# Patient Record
Sex: Male | Born: 2000
Health system: Southern US, Community
[De-identification: ages and names within clinical notes are randomized; demographics above are authoritative.]

## PROBLEM LIST (undated history)

## (undated) HISTORY — PX: APPENDECTOMY: SHX54

---
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
## 2023-05-29 ENCOUNTER — Emergency Department (HOSPITAL_BASED_OUTPATIENT_CLINIC_OR_DEPARTMENT_OTHER): Payer: Commercial Managed Care - PPO | Admitting: Radiology

## 2023-05-29 ENCOUNTER — Other Ambulatory Visit: Payer: Self-pay

## 2023-05-29 ENCOUNTER — Emergency Department (HOSPITAL_BASED_OUTPATIENT_CLINIC_OR_DEPARTMENT_OTHER): Payer: Commercial Managed Care - PPO

## 2023-05-29 ENCOUNTER — Emergency Department (HOSPITAL_BASED_OUTPATIENT_CLINIC_OR_DEPARTMENT_OTHER)
Admission: EM | Admit: 2023-05-29 | Discharge: 2023-05-30 | Disposition: A | Discharge status: Home / Self Care | Payer: Commercial Managed Care - PPO | Attending: Emergency Medicine | Admitting: Emergency Medicine

## 2023-05-29 ENCOUNTER — Encounter (HOSPITAL_BASED_OUTPATIENT_CLINIC_OR_DEPARTMENT_OTHER): Payer: Self-pay

## 2023-05-29 DIAGNOSIS — R1012 Left upper quadrant pain: Secondary | ICD-10-CM | POA: Insufficient documentation

## 2023-05-29 LAB — CBC WITH DIFFERENTIAL/PLATELET
Abs Immature Granulocytes: 0.05 10*3/uL (ref 0.00–0.07)
Basophils Absolute: 0.1 10*3/uL (ref 0.0–0.1)
Basophils Relative: 1 %
Eosinophils Absolute: 0.2 10*3/uL (ref 0.0–0.5)
Eosinophils Relative: 2 %
HCT: 44.4 % (ref 39.0–52.0)
Hemoglobin: 15.2 g/dL (ref 13.0–17.0)
Immature Granulocytes: 1 %
Lymphocytes Relative: 22 %
Lymphs Abs: 2.3 10*3/uL (ref 0.7–4.0)
MCH: 29.5 pg (ref 26.0–34.0)
MCHC: 34.2 g/dL (ref 30.0–36.0)
MCV: 86 fL (ref 80.0–100.0)
Monocytes Absolute: 1 10*3/uL (ref 0.1–1.0)
Monocytes Relative: 10 %
Neutro Abs: 6.7 10*3/uL (ref 1.7–7.7)
Neutrophils Relative %: 64 %
Platelets: 203 10*3/uL (ref 150–400)
RBC: 5.16 MIL/uL (ref 4.22–5.81)
RDW: 12.5 % (ref 11.5–15.5)
WBC: 10.3 10*3/uL (ref 4.0–10.5)
nRBC: 0 % (ref 0.0–0.2)

## 2023-05-29 LAB — COMPREHENSIVE METABOLIC PANEL
ALT: 52 U/L — ABNORMAL HIGH (ref 0–44)
AST: 25 U/L (ref 15–41)
Albumin: 4.6 g/dL (ref 3.5–5.0)
Alkaline Phosphatase: 60 U/L (ref 38–126)
Anion gap: 8 (ref 5–15)
BUN: 16 mg/dL (ref 6–20)
CO2: 29 mmol/L (ref 22–32)
Calcium: 10 mg/dL (ref 8.9–10.3)
Chloride: 102 mmol/L (ref 98–111)
Creatinine, Ser: 1.07 mg/dL (ref 0.61–1.24)
GFR, Estimated: >60 mL/min (ref >60)
Glucose, Bld: 103 mg/dL — ABNORMAL HIGH (ref 70–99)
Potassium: 3.8 mmol/L (ref 3.5–5.1)
Sodium: 139 mmol/L (ref 135–145)
Total Bilirubin: 0.3 mg/dL (ref <1.2)
Total Protein: 7.4 g/dL (ref 6.5–8.1)

## 2023-05-29 LAB — URINALYSIS, W/ REFLEX TO CULTURE (INFECTION SUSPECTED)
Bacteria, UA: NONE SEEN
Bilirubin Urine: NEGATIVE
Glucose, UA: NEGATIVE mg/dL
Hgb urine dipstick: NEGATIVE
Ketones, ur: NEGATIVE mg/dL
Leukocytes,Ua: NEGATIVE
Nitrite: NEGATIVE
Protein, ur: NEGATIVE mg/dL
Specific Gravity, Urine: 1.01 (ref 1.005–1.030)
pH: 7 (ref 5.0–8.0)

## 2023-05-29 LAB — LIPASE, BLOOD: Lipase: 17 U/L (ref 11–51)

## 2023-05-29 LAB — TROPONIN I (HIGH SENSITIVITY): Troponin I (High Sensitivity): 2 ng/L (ref <18)

## 2023-05-29 MED ORDER — ALUM & MAG HYDROXIDE-SIMETH 200-200-20 MG/5ML PO SUSP
30.0000 mL | Freq: Once | ORAL | Status: AC
Start: 2023-05-29 — End: 2023-05-29
  Administered 2023-05-29: 30 mL via ORAL
  Filled 2023-05-29: qty 30

## 2023-05-29 MED ORDER — LIDOCAINE VISCOUS HCL 2 % MT SOLN
15.0000 mL | Freq: Once | OROMUCOSAL | Status: DC
  Filled 2023-05-29: qty 15

## 2023-05-29 MED ORDER — PANTOPRAZOLE SODIUM 20 MG PO TBEC: 20.0000 mg | DELAYED_RELEASE_TABLET | Freq: Every day | ORAL | 0 refills | Status: DC

## 2023-05-29 MED ORDER — IOHEXOL 300 MG/ML  SOLN
100.0000 mL | Freq: Once | INTRAMUSCULAR | Status: AC | PRN
  Administered 2023-05-29: 100 mL via INTRAVENOUS

## 2023-05-29 NOTE — ED Provider Notes (Signed)
  Physical Exam  BP (!) 160/74 (BP Location: Right Arm)   Pulse 85   Temp 98.1 F (36.7 C) (Oral)   Resp 20   Ht 6\' 4"  (1.93 m)   Wt (!) 145.2 kg   SpO2 100%   BMI 38.95 kg/m     Medical Decision Making Amount and/or Complexity of Data Reviewed Labs: ordered. Radiology: ordered.  Risk OTC drugs. Prescription drug management.   Patient is a 22 year old male presenting to emergency room with abdominal pain.  Patient reports abdominal pain is in the left upper quadrant.  Reports he has had intermittent constipation and diarrhea, for the past 10 days.  Patient has reported some associated nausea but has not had any increase in appetite.  Patient has no elevated lipase, no leukocytosis, no anemia.  Patient without any obvious electrolyte abnormality malady on CMP.  Please review prior ED provider's note for further information in HPI.  Received patient at shift from prior the prior ED PA.  Patient's likely stable for discharge however pending CT scan of abdomen and pelvis.  Spoke with patient who feels he is comfortable going home.  Patient reports his symptoms have improved.  Pantoprazole has already been sent for acid reflux.  I have also sent Bentyl for abdominal cramping.  Patient tolerating p.o. intake and overall well-appearing.  Hemodynamically stable.  No acute abnormality noted on CT scan.  Patient is stable for discharge.  Patient to follow-up with primary care to ensure resolution of symptoms.       Smitty Knudsen, PA-C 05/30/23 0144    Glyn Ade, MD 06/02/23 1455

## 2023-05-29 NOTE — ED Provider Notes (Signed)
[facility] EMERGENCY DEPARTMENT AT [facility] Provider Note   CSN: [id number] Arrival date & time: 05/29/23  2012    History  Chief Complaint  Patient presents with   Abdominal Pain    Bruce Boyd is a 22 y.o. male here for evaluation of epigastric and left upper quadrant pain that started about 3 hours PTA.  Describes the pain as sharp, aching.  Does not radiate to back.  Goes from his left lower chest wall into his upper abdomen.  He has never had anything like this previously.  Some nausea that vomiting.  States he did have "reflux" after eating pizza earlier tonight for dinner.  No fever, chest pain, shortness of breath, pain or swelling to lower legs, no history of PE or DVT.  No recent surgery, immobilization or malignancy.  He did report 2 weeks ago he had "severe" constipation.  Took mag citrate.  Evacuate his bowels a few days later however states he has not been "right" since then."  Feels like his bowel movements are not as large.  He denies any melena or bright red per rectum.  No history of kidney stones.  No pain or swelling to scrotum.  No cough, congestion, rhinorrhea.  Hx of appendectomy.   HPI     Home Medications Prior to Admission medications   Medication Sig Start Date End Date Taking? Authorizing Provider  pantoprazole (PROTONIX) 20 MG tablet Take 1 tablet (20 mg total) by mouth daily. 05/29/23  Yes Jeno Calleros A, PA-C      Allergies    Morphine    Review of Systems   Review of Systems  Constitutional: Negative.   HENT: Negative.    Respiratory: Negative.    Cardiovascular: Negative.   Gastrointestinal:  Positive for abdominal pain, constipation (resolved) and nausea. Negative for abdominal distention, anal bleeding, blood in stool, diarrhea, rectal pain and vomiting.  Genitourinary: Negative.   Musculoskeletal: Negative.   Skin: Negative.   Neurological: Negative.   All other systems reviewed and are negative.   Physical  Exam Updated Vital Signs BP (!) 160/74 (BP Location: Right Arm)   Pulse 85   Temp 98.1 F (36.7 C) (Oral)   Resp 20   Ht 6\' 4"  (1.93 m)   Wt (!) 145.2 kg   SpO2 100%   BMI 38.95 kg/m  Physical Exam Vitals and nursing note reviewed.  Constitutional:      General: He is not in acute distress.    Appearance: He is well-developed. He is not ill-appearing, toxic-appearing or diaphoretic.  HENT:     Head: Normocephalic and atraumatic.  Eyes:     Pupils: Pupils are equal, round, and reactive to light.  Cardiovascular:     Rate and Rhythm: Normal rate and regular rhythm.     Heart sounds: Normal heart sounds.  Pulmonary:     Effort: Pulmonary effort is normal. No respiratory distress.     Breath sounds: Normal breath sounds.  Abdominal:     General: Bowel sounds are normal. There is no distension.     Palpations: Abdomen is soft.     Tenderness: There is abdominal tenderness in the epigastric area and left upper quadrant. There is no right CVA tenderness, left CVA tenderness, guarding or rebound. Negative signs include Murphy's sign.     Hernia: No hernia is present.  Musculoskeletal:        General: Normal range of motion.     Cervical back: Normal range of motion and  neck supple.  Skin:    General: Skin is warm and dry.  Neurological:     General: No focal deficit present.     Mental Status: He is alert and oriented to person, place, and time.     ED Results / Procedures / Treatments   Labs (all labs ordered are listed, but only abnormal results are displayed) Labs Reviewed  CBC WITH DIFFERENTIAL/PLATELET  COMPREHENSIVE METABOLIC PANEL  LIPASE, BLOOD  URINALYSIS, W/ REFLEX TO CULTURE (INFECTION SUSPECTED)  TROPONIN I (HIGH SENSITIVITY)    EKG None  Radiology No results found.  Procedures Procedures    Medications Ordered in ED Medications  alum & mag hydroxide-simeth (MAALOX/MYLANTA) 200-200-20 MG/5ML suspension 30 mL (30 mLs Oral Given 05/29/23 2110)     And  lidocaine (XYLOCAINE) 2 % viscous mouth solution 15 mL (15 mLs Oral Not Given 05/29/23 2118)    ED Course/ Medical Decision Making/ A&P   22 year old here for evaluation of left upper abdominal pain which began about 3 hours PTA.  Noted few weeks ago he was constipated, subsequently cleared out after mag citrate however still has not been "right."  Ate pizza earlier his evening and stated he had "severe reflux" and then developed some left upper abdominal pain which starts at the left lower chest wall into his left upper abdomen.  Does not radiate.  No urinary symptoms.  He denies any overt chest pain, shortness of breath.  No clinical evidence of VTE, no recent surgery, immobilization or malignancy.  He is PERC negative.  Will plan on labs, imaging and reassess  Labs and imaging personally viewed and interpreted:  CBC without leukocytosis EKG without ischemic changes  Care transferred to oncoming provider who will FU on remaining labs, imaging and disposition.                                  Medical Decision Making Amount and/or Complexity of Data Reviewed Independent Historian: parent External Data Reviewed: labs, radiology, ECG and notes. Labs: ordered. Decision-making details documented in ED Course. Radiology: ordered and independent interpretation performed. Decision-making details documented in ED Course. ECG/medicine tests: ordered and independent interpretation performed. Decision-making details documented in ED Course.  Risk OTC drugs. Prescription drug management. Parenteral controlled substances. Decision regarding hospitalization. Diagnosis or treatment significantly limited by social determinants of health.          Final Clinical Impression(s) / ED Diagnoses Final diagnoses:  Left upper quadrant abdominal pain    Rx / DC Orders ED Discharge Orders          Ordered    pantoprazole (PROTONIX) 20 MG tablet  Daily        05/29/23 2134               Makella Buckingham A, PA-C 05/29/23 2135    Glyn Ade, MD 05/29/23 2215

## 2023-05-29 NOTE — Discharge Instructions (Signed)
It was a pleasure taking care of you in the ED today  Take the medications as prescribed  Follow up outpatient  Return for new or worsening symptoms

## 2023-05-29 NOTE — ED Notes (Signed)
Pt unable to provide a urine sample at this time.

## 2023-05-29 NOTE — ED Triage Notes (Addendum)
Pt c/o LUQ/epigastric pain that started 2-3 hours PTA. Reports 'constipation for first time in my life' last week. Reports constipation 11/19-11/24, took Mag Citrate w/ relief for 2 days, constipated again 11/26-11/29. Last BM today. Nausea, denies vomiting.   Hx appendectomy.

## 2023-05-30 MED ORDER — PANTOPRAZOLE SODIUM 20 MG PO TBEC: 20.0000 mg | DELAYED_RELEASE_TABLET | Freq: Every day | ORAL | 0 refills | Status: AC

## 2023-05-30 MED ORDER — DICYCLOMINE HCL 10 MG PO CAPS
10.0000 mg | ORAL_CAPSULE | Freq: Once | ORAL | Status: AC
  Administered 2023-05-30: 10 mg via ORAL
  Filled 2023-05-30: qty 1

## 2023-05-30 MED ORDER — DICYCLOMINE HCL 20 MG PO TABS: 20.0000 mg | ORAL_TABLET | Freq: Two times a day (BID) | ORAL | 0 refills | Status: AC
